# Patient Record
Sex: Male | Born: 1980 | Race: Black or African American | Hispanic: No | Marital: Single | Smoking: Current some day smoker
Health system: Southern US, Community
[De-identification: ages and names within clinical notes are randomized; demographics above are authoritative.]

## PROBLEM LIST (undated history)

## (undated) DIAGNOSIS — B20 Human immunodeficiency virus [HIV] disease: Secondary | ICD-10-CM

---

## 2005-01-31 ENCOUNTER — Ambulatory Visit: Payer: Self-pay | Admitting: Family Medicine

## 2005-07-01 ENCOUNTER — Emergency Department (HOSPITAL_COMMUNITY): Admission: EM | Admit: 2005-07-01 | Discharge: 2005-07-01 | Payer: Self-pay | Admitting: Emergency Medicine

## 2007-05-28 IMAGING — CT CT HEAD W/O CM
1 series · 16 of 30 positions shown, 20 images · IV contrast (agent unspecified)
Comparison: None.

CLINICAL DATA: Possible seizure. 
 HEAD CT WITHOUT CONTRAST:
TECHNIQUE: Contiguous axial images were obtained from the base of the skull through the vertex according to standard protocol without contrast.
 There is no evidence of intracranial hemorrhage, brain edema, or mass effect.  No other intra-axial abnormalities are seen, and the ventricles are within normal limits.  No abnormal extra-axial fluid collections or masses are identified.  No skull abnormalities are noted.

[Series 2578: — · axial · 0.46mm/px · z∈[-654,-519]mm · 16 of 30 slices shown, 20 images]
[im 2/30  brain]
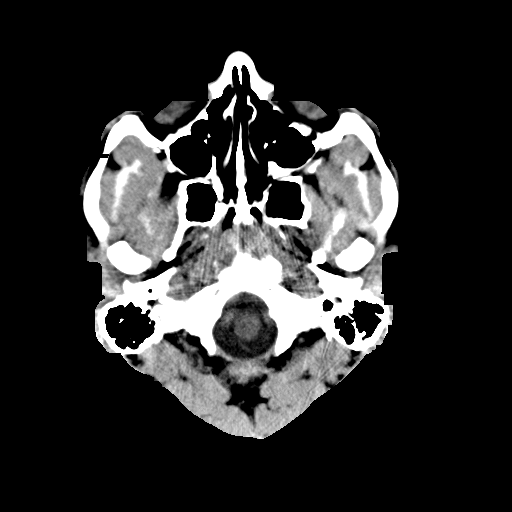
[im 2/30  bone]
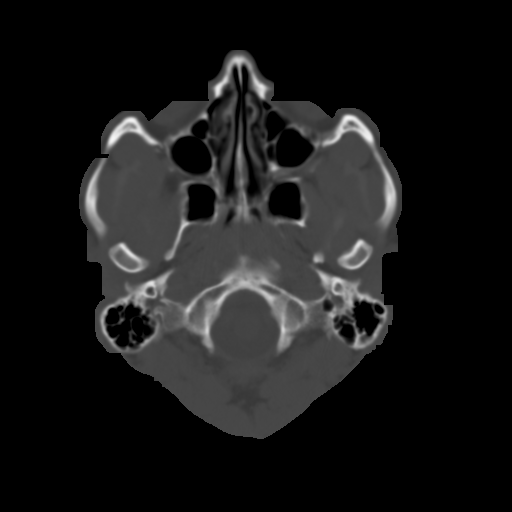
[im 4/30  brain]
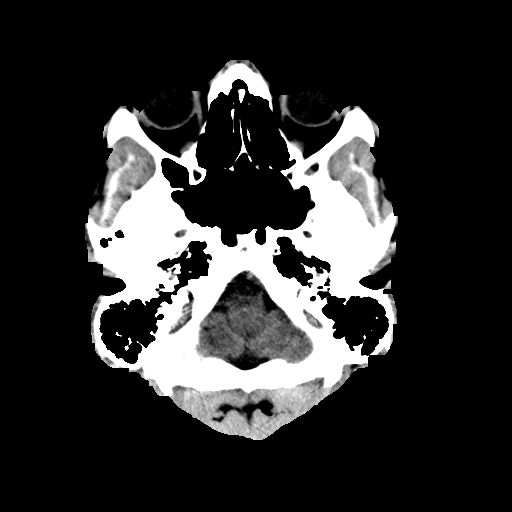
[im 6/30  brain]
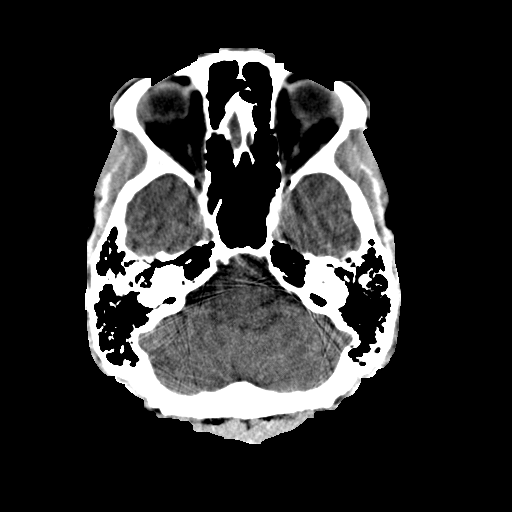
[im 8/30  brain]
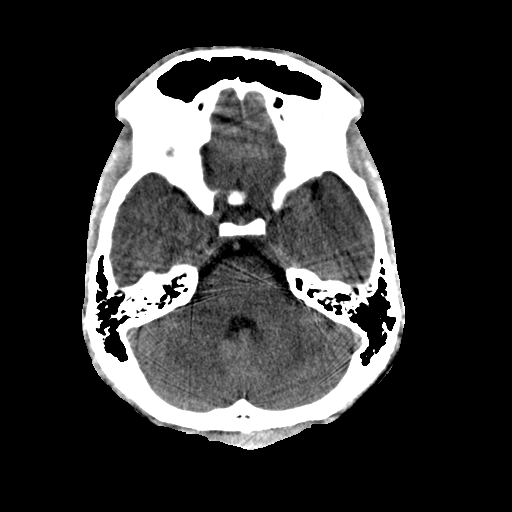
[im 9/30  brain]
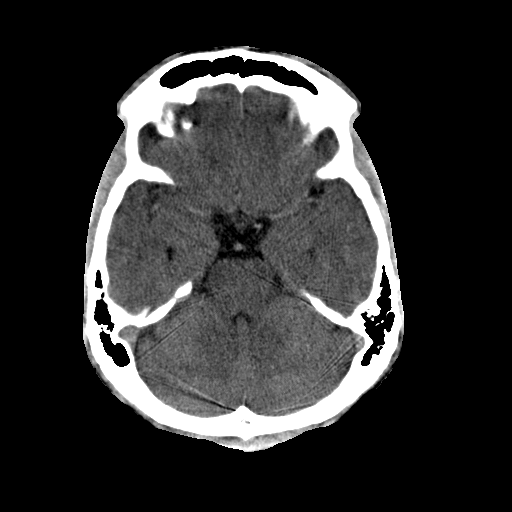
[im 9/30  bone]
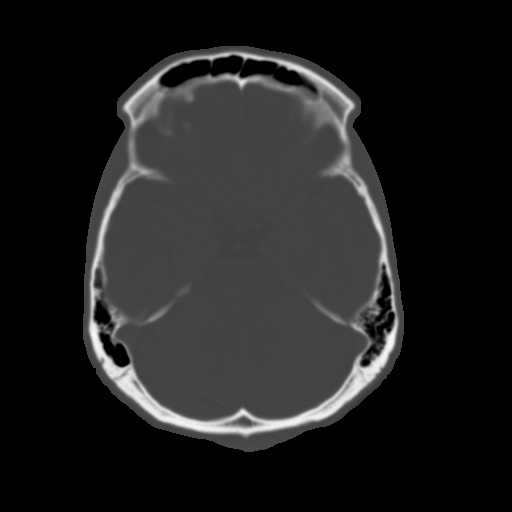
[im 11/30  brain]
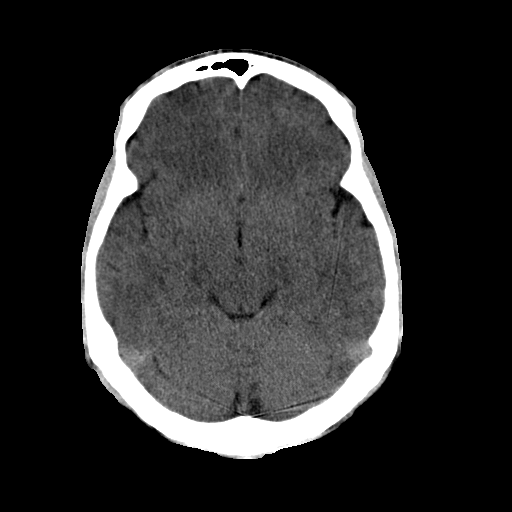
[im 13/30  brain]
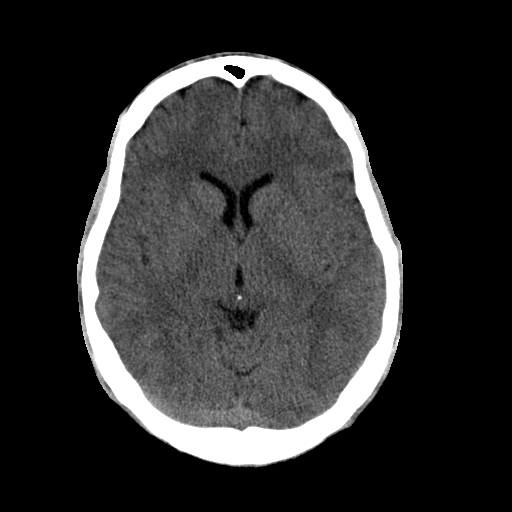
[im 15/30  brain]
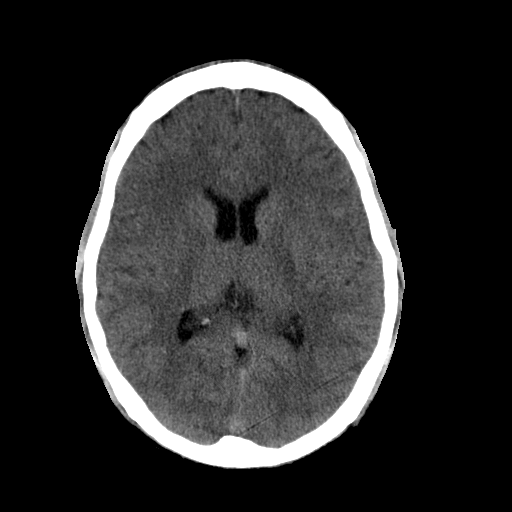
[im 16/30  brain]
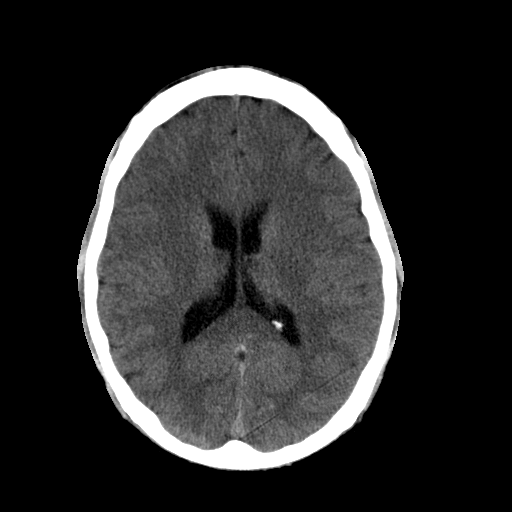
[im 16/30  bone]
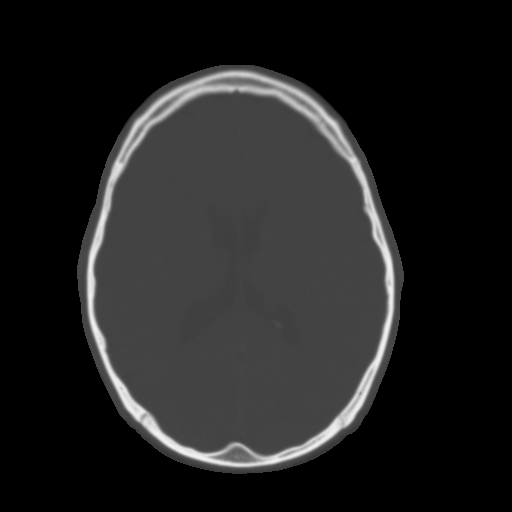
[im 18/30  brain]
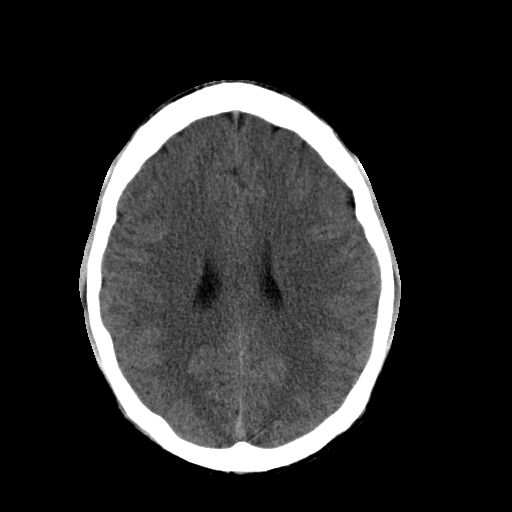
[im 20/30  brain]
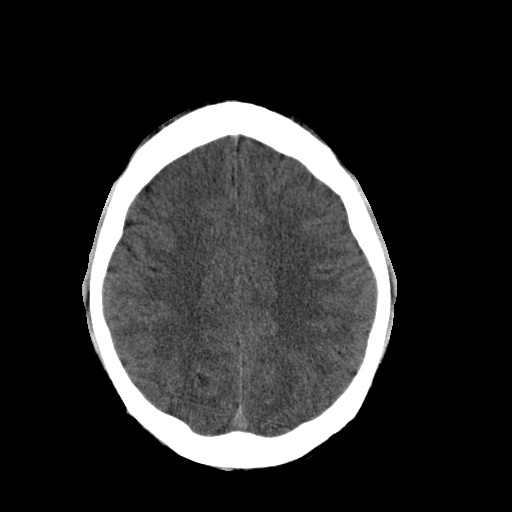
[im 22/30  brain]
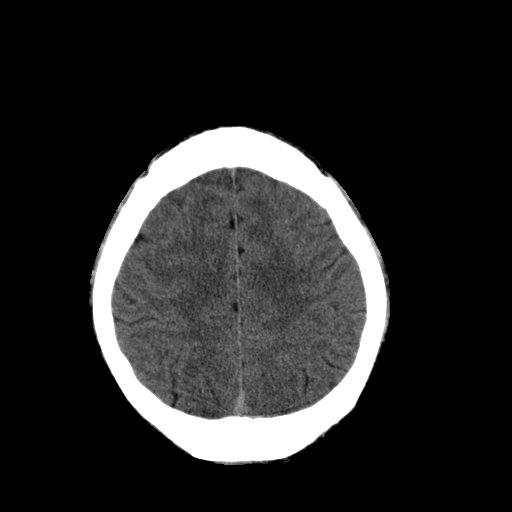
[im 23/30  brain]
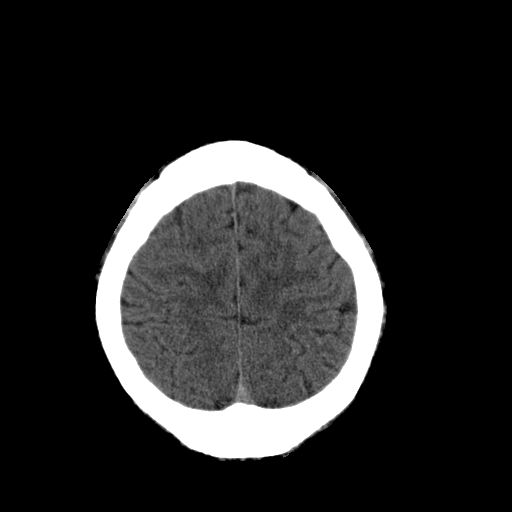
[im 23/30  bone]
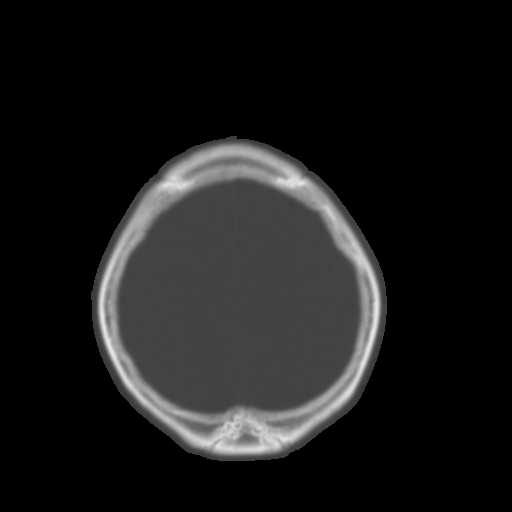
[im 25/30  brain]
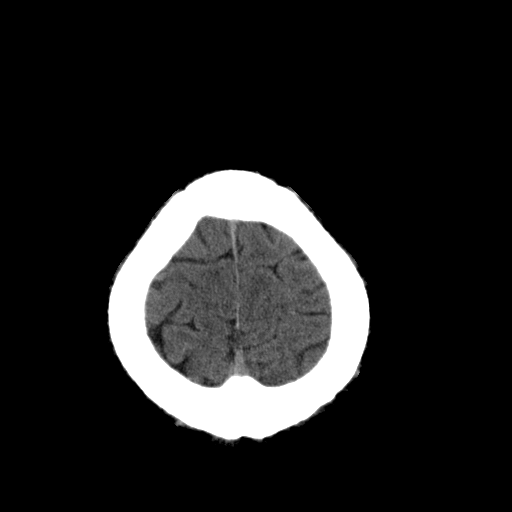
[im 27/30  brain]
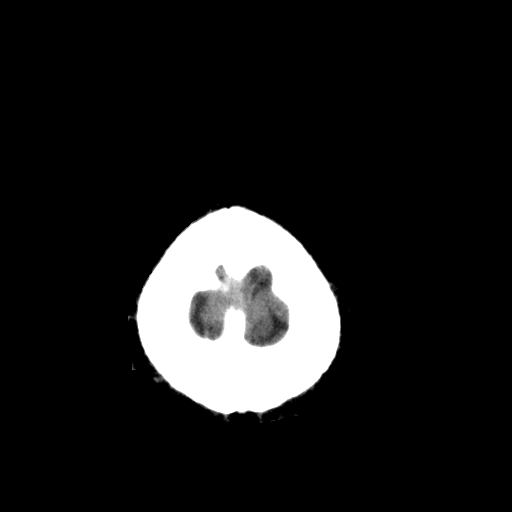
[im 29/30  brain]
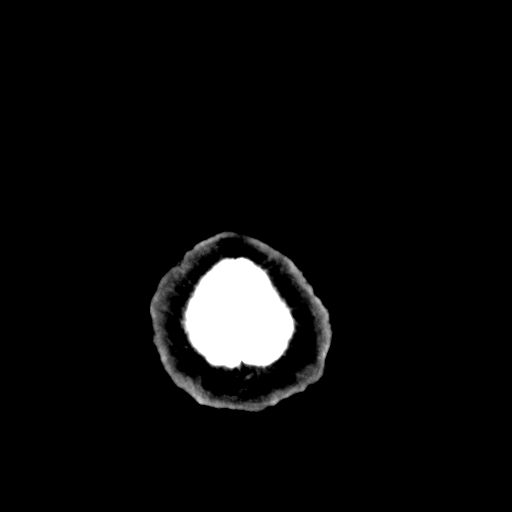

[16 of 30 positions shown; findings below may reference images not displayed]

IMPRESSION: Negative non-contrast head CT.

## 2009-11-26 ENCOUNTER — Emergency Department (HOSPITAL_COMMUNITY): Admission: EM | Admit: 2009-11-26 | Discharge: 2009-11-26 | Payer: Self-pay | Admitting: Emergency Medicine

## 2010-12-09 LAB — DIFFERENTIAL
Basophils Absolute: 0 10*3/uL (ref 0.0–0.1)
Basophils Relative: 0 % (ref 0–1)
Eosinophils Absolute: 0 10*3/uL (ref 0.0–0.7)
Eosinophils Relative: 0 % (ref 0–5)
Lymphocytes Relative: 62 % — ABNORMAL HIGH (ref 12–46)
Lymphs Abs: 6.9 10*3/uL — ABNORMAL HIGH (ref 0.7–4.0)
Monocytes Absolute: 1.8 10*3/uL — ABNORMAL HIGH (ref 0.1–1.0)
Monocytes Relative: 16 % — ABNORMAL HIGH (ref 3–12)
Neutro Abs: 2.5 10*3/uL (ref 1.7–7.7)
Neutrophils Relative %: 22 % — ABNORMAL LOW (ref 43–77)

## 2010-12-09 LAB — CBC
HCT: 41.5 % (ref 39.0–52.0)
Hemoglobin: 14.1 g/dL (ref 13.0–17.0)
MCHC: 34 g/dL (ref 30.0–36.0)
MCV: 82.6 fL (ref 78.0–100.0)
Platelets: 136 10*3/uL — ABNORMAL LOW (ref 150–400)
RBC: 5.03 MIL/uL (ref 4.22–5.81)
RDW: 13.3 % (ref 11.5–15.5)
WBC: 11.2 10*3/uL — ABNORMAL HIGH (ref 4.0–10.5)

## 2010-12-09 LAB — URINALYSIS, ROUTINE W REFLEX MICROSCOPIC
Glucose, UA: NEGATIVE mg/dL
Hgb urine dipstick: NEGATIVE
Ketones, ur: NEGATIVE mg/dL
Nitrite: NEGATIVE
Protein, ur: NEGATIVE mg/dL
Specific Gravity, Urine: 1.02 (ref 1.005–1.030)
Urobilinogen, UA: >8 mg/dL — ABNORMAL HIGH (ref 0.0–1.0)
pH: 7.5 (ref 5.0–8.0)

## 2010-12-09 LAB — BASIC METABOLIC PANEL
BUN: 8 mg/dL (ref 6–23)
CO2: 25 mEq/L (ref 19–32)
Calcium: 8.2 mg/dL — ABNORMAL LOW (ref 8.4–10.5)
Chloride: 99 mEq/L (ref 96–112)
Creatinine, Ser: 1.07 mg/dL (ref 0.4–1.5)
GFR calc Af Amer: >60 mL/min (ref >60)
GFR calc non Af Amer: >60 mL/min (ref >60)
Glucose, Bld: 104 mg/dL — ABNORMAL HIGH (ref 70–99)
Potassium: 3.4 mEq/L — ABNORMAL LOW (ref 3.5–5.1)
Sodium: 132 mEq/L — ABNORMAL LOW (ref 135–145)

## 2010-12-09 LAB — CK: Total CK: 41 U/L (ref 7–232)

## 2010-12-09 LAB — HEPATIC FUNCTION PANEL
ALT: 150 U/L — ABNORMAL HIGH (ref 0–53)
AST: 127 U/L — ABNORMAL HIGH (ref 0–37)
Albumin: 3 g/dL — ABNORMAL LOW (ref 3.5–5.2)
Alkaline Phosphatase: 83 U/L (ref 39–117)
Bilirubin, Direct: 0.4 mg/dL — ABNORMAL HIGH (ref 0.0–0.3)
Indirect Bilirubin: 0.6 mg/dL (ref 0.3–0.9)
Total Bilirubin: 1 mg/dL (ref 0.3–1.2)
Total Protein: 6.4 g/dL (ref 6.0–8.3)

## 2014-07-26 ENCOUNTER — Emergency Department (HOSPITAL_COMMUNITY): Payer: Self-pay

## 2014-07-26 ENCOUNTER — Encounter (HOSPITAL_COMMUNITY): Payer: Self-pay | Admitting: Emergency Medicine

## 2014-07-26 ENCOUNTER — Emergency Department (HOSPITAL_COMMUNITY)
Admission: EM | Admit: 2014-07-26 | Discharge: 2014-07-26 | Disposition: A | Discharge status: Home / Self Care | Payer: Self-pay | Attending: Emergency Medicine | Admitting: Emergency Medicine

## 2014-07-26 DIAGNOSIS — Z720 Tobacco use: Secondary | ICD-10-CM | POA: Insufficient documentation

## 2014-07-26 DIAGNOSIS — T1490XA Injury, unspecified, initial encounter: Secondary | ICD-10-CM

## 2014-07-26 DIAGNOSIS — Z21 Asymptomatic human immunodeficiency virus [HIV] infection status: Secondary | ICD-10-CM | POA: Insufficient documentation

## 2014-07-26 DIAGNOSIS — S93402A Sprain of unspecified ligament of left ankle, initial encounter: Secondary | ICD-10-CM | POA: Insufficient documentation

## 2014-07-26 DIAGNOSIS — Y92320 Baseball field as the place of occurrence of the external cause: Secondary | ICD-10-CM | POA: Insufficient documentation

## 2014-07-26 DIAGNOSIS — Y9364 Activity, baseball: Secondary | ICD-10-CM | POA: Insufficient documentation

## 2014-07-26 DIAGNOSIS — Y998 Other external cause status: Secondary | ICD-10-CM | POA: Insufficient documentation

## 2014-07-26 DIAGNOSIS — X58XXXA Exposure to other specified factors, initial encounter: Secondary | ICD-10-CM | POA: Insufficient documentation

## 2014-07-26 DIAGNOSIS — Z791 Long term (current) use of non-steroidal anti-inflammatories (NSAID): Secondary | ICD-10-CM | POA: Insufficient documentation

## 2014-07-26 HISTORY — DX: Human immunodeficiency virus (HIV) disease: B20

## 2014-07-26 MED ORDER — HYDROCODONE-ACETAMINOPHEN 5-325 MG PO TABS: 1.0000 | ORAL_TABLET | ORAL | Status: AC | PRN

## 2014-07-26 MED ORDER — KETOROLAC TROMETHAMINE 10 MG PO TABS
10.0000 mg | ORAL_TABLET | Freq: Once | ORAL | Status: AC
  Administered 2014-07-26: 10 mg via ORAL
  Filled 2014-07-26: qty 1

## 2014-07-26 MED ORDER — DICLOFENAC SODIUM 75 MG PO TBEC: 75.0000 mg | DELAYED_RELEASE_TABLET | Freq: Two times a day (BID) | ORAL | Status: AC

## 2014-07-26 MED ORDER — HYDROCODONE-ACETAMINOPHEN 5-325 MG PO TABS
1.0000 | ORAL_TABLET | Freq: Once | ORAL | Status: AC
  Administered 2014-07-26: 1 via ORAL
  Filled 2014-07-26: qty 1

## 2014-07-26 NOTE — ED Notes (Signed)
Pt was playing softball last night, injured his L ankle when he slid into a base.

## 2014-07-26 NOTE — ED Provider Notes (Signed)
CSN: [id number]     Arrival date & time 07/26/14  1420 History   First MD Initiated Contact with Patient 07/26/14 1629     Chief Complaint  Patient presents with  . Ankle Pain     (Consider location/radiation/quality/duration/timing/severity/associated sxs/prior Treatment) Patient is a 33 y.o. male presenting with ankle pain. The history is provided by the patient.  Ankle Pain Location:  Ankle Injury: yes   Mechanism of injury comment:  Slid into base playing baseball. Ankle location:  L ankle Pain details:    Quality:  Aching and throbbing   Radiates to:  Does not radiate   Severity:  Moderate   Onset quality:  Sudden   Duration:  1 day   Timing:  Constant   Progression:  Unchanged Chronicity:  New Dislocation: no   Foreign body present:  No foreign bodies Relieved by:  Nothing Worsened by:  Bearing weight Ineffective treatments:  Compression Associated symptoms: decreased ROM and swelling   Associated symptoms: no back pain, no neck pain, no numbness and no tingling   Risk factors: no frequent fractures     Past Medical History  Diagnosis Date  . HIV (human immunodeficiency virus infection)    History reviewed. No pertinent past surgical history. Family History  Problem Relation Age of Onset  . Diabetes Other   . Heart failure Other    History  Substance Use Topics  . Smoking status: Current Some Day Smoker  . Smokeless tobacco: Never Used  . Alcohol Use: No    Review of Systems  Constitutional: Negative for activity change.       All ROS Neg except as noted in HPI  Eyes: Negative for photophobia and discharge.  Respiratory: Negative for cough, shortness of breath and wheezing.   Cardiovascular: Negative for chest pain and palpitations.  Gastrointestinal: Negative for abdominal pain and blood in stool.  Genitourinary: Negative for dysuria, frequency and hematuria.  Musculoskeletal: Negative for back pain, arthralgias and neck pain.  Skin: Negative.    Neurological: Negative for dizziness, seizures and speech difficulty.  Psychiatric/Behavioral: Negative for hallucinations and confusion.      Allergies  Review of patient's allergies indicates no known allergies.  Home Medications   Prior to Admission medications   Medication Sig Start Date End Date Taking? Authorizing Provider  ATRIPLA 600-200-300 MG per tablet Take 1 tablet by mouth every evening.  07/20/14  Yes Historical Provider, MD  diclofenac (VOLTAREN) 75 MG EC tablet Take 1 tablet (75 mg total) by mouth 2 (two) times daily. 07/26/14   Kathie DikeHobson M Deklan Minar, PA-C  HYDROcodone-acetaminophen (NORCO/VICODIN) 5-325 MG per tablet Take 1 tablet by mouth every 4 (four) hours as needed. 07/26/14   Kathie DikeHobson M Raheim Beutler, PA-C   BP 146/80 mmHg  Pulse 90  Temp(Src) 98.9 F (37.2 C) (Oral)  Resp 16  Ht 6\' 1"  (1.854 m)  Wt 175 lb (79.379 kg)  BMI 23.09 kg/m2  SpO2 100% Physical Exam  Constitutional: He is oriented to person, place, and time. He appears well-developed and well-nourished.  Non-toxic appearance.  HENT:  Head: Normocephalic.  Right Ear: Tympanic membrane and external ear normal.  Left Ear: Tympanic membrane and external ear normal.  Eyes: EOM and lids are normal. Pupils are equal, round, and reactive to light.  Neck: Normal range of motion. Neck supple. Carotid bruit is not present.  Cardiovascular: Normal rate, regular rhythm, normal heart sounds, intact distal pulses and normal pulses.   Pulmonary/Chest: Breath sounds normal. No respiratory distress.  Abdominal: Soft. Bowel sounds are normal. There is no tenderness. There is no guarding.  Musculoskeletal:       Left ankle: He exhibits decreased range of motion and swelling. He exhibits no deformity. Tenderness. Lateral malleolus tenderness found. Achilles tendon normal.  Lymphadenopathy:       Head (right side): No submandibular adenopathy present.       Head (left side): No submandibular adenopathy present.    He has no  cervical adenopathy.  Neurological: He is alert and oriented to person, place, and time. He has normal strength. No cranial nerve deficit or sensory deficit.  Skin: Skin is warm and dry.  Psychiatric: He has a normal mood and affect. His speech is normal.  Nursing note and vitals reviewed.   ED Course  Procedures (including critical care time) Labs Review Labs Reviewed - No data to display  Imaging Review Dg Ankle Complete Left  07/26/2014   CLINICAL DATA:  Lateral left ankle pain. Injury while sliding into a based plane softball yesterday. Initial encounter.  EXAM: LEFT ANKLE COMPLETE - 3+ VIEW  COMPARISON:  None.  FINDINGS: Lateral soft tissue swelling. No acute bony abnormality. Specifically, no fracture, subluxation, or dislocation. Soft tissues are intact.  IMPRESSION: No acute bony abnormality.   Electronically Signed   By: Charlett NoseKevin  Dover M.D.   On: 07/26/2014 15:22     EKG Interpretation None      MDM  Vital signs are within normal limits. Xray of the left ankle is neg for fx or dislocation. No neurovascular deficit. Plan - ASO splint for the next 10 days. Crutches. Rx for diclofenac and norco. Pt to see orthopedics if not improving.   Final diagnoses:  Ankle sprain, left, initial encounter    **I have reviewed nursing notes, vital signs, and all appropriate lab and imaging results for this patient.Kathie Dike*    Tyiana Hill M Levar Fayson, PA-C 07/26/14 1723  Benny LennertJoseph L Zammit, MD 07/26/14 [phone]

## 2014-07-26 NOTE — Discharge Instructions (Signed)
Apply ice. Elevate your ankle as much as possible. Use the ankle splint for the next 10 days. Use diclofenac daily with food. Use norco for pain if needed.This medication may cause drowsiness. Please do not drink, drive, or participate in activity that requires concentration while taking this medication. Ankle Sprain An ankle sprain is an injury to the strong, fibrous tissues (ligaments) that hold your ankle bones together.  HOME CARE   Put ice on your ankle for 1-2 days or as told by your doctor.  Put ice in a plastic bag.  Place a towel between your skin and the bag.  Leave the ice on for 15-20 minutes at a time, every 2 hours while you are awake.  Only take medicine as told by your doctor.  Raise (elevate) your injured ankle above the level of your heart as much as possible for 2-3 days.  Use crutches if your doctor tells you to. Slowly put your own weight on the affected ankle. Use the crutches until you can walk without pain.  If you have a plaster splint:  Do not rest it on anything harder than a pillow for 24 hours.  Do not put weight on it.  Do not get it wet.  Take it off to shower or bathe.  If given, use an elastic wrap or support stocking for support. Take the wrap off if your toes lose feeling (numb), tingle, or turn cold or blue.  If you have an air splint:  Add or let out air to make it comfortable.  Take it off at night and to shower and bathe.  Wiggle your toes and move your ankle up and down often while you are wearing it. GET HELP IF:  You have rapidly increasing bruising or puffiness (swelling).  Your toes feel very cold.  You lose feeling in your foot.  Your medicine does not help your pain. GET HELP RIGHT AWAY IF:   Your toes lose feeling (numb) or turn blue.  You have severe pain that is increasing. MAKE SURE YOU:   Understand these instructions.  Will watch your condition.  Will get help right away if you are not doing well or get  worse. Document Released: 02/18/2008 Document Revised: 01/16/2014 Document Reviewed: 03/15/2012 [facility] Patient Information 2015 [place], [place]. This information is not intended to replace advice given to you by your health care provider. Make sure you discuss any questions you have with your health care provider.

## 2016-06-21 IMAGING — CR DG ANKLE COMPLETE 3+V*L*
3 series · 3 of 3 positions shown · non-contrast
Comparison: None.

CLINICAL DATA: Lateral left ankle pain. Injury while sliding into a
based plane softball yesterday. Initial encounter.

EXAM:
LEFT ANKLE COMPLETE - 3+ VIEW

[view not recorded (1 of 3)]
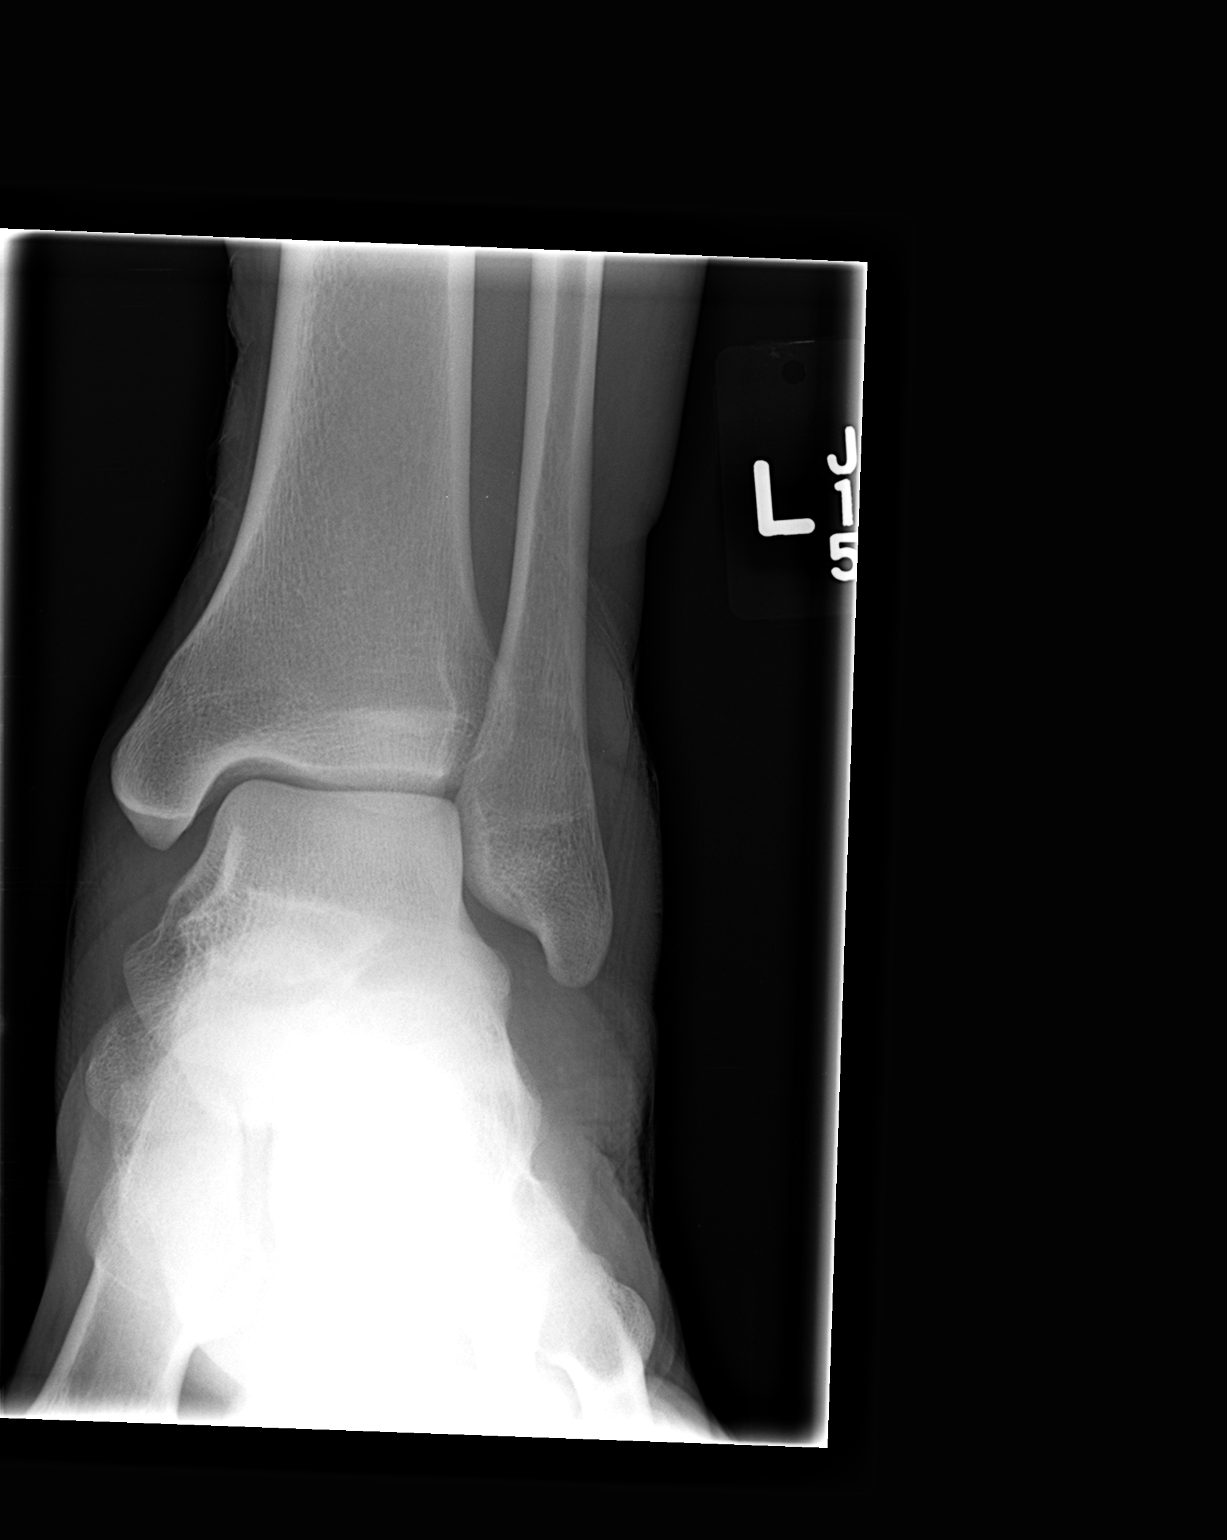

[view not recorded (2 of 3)]
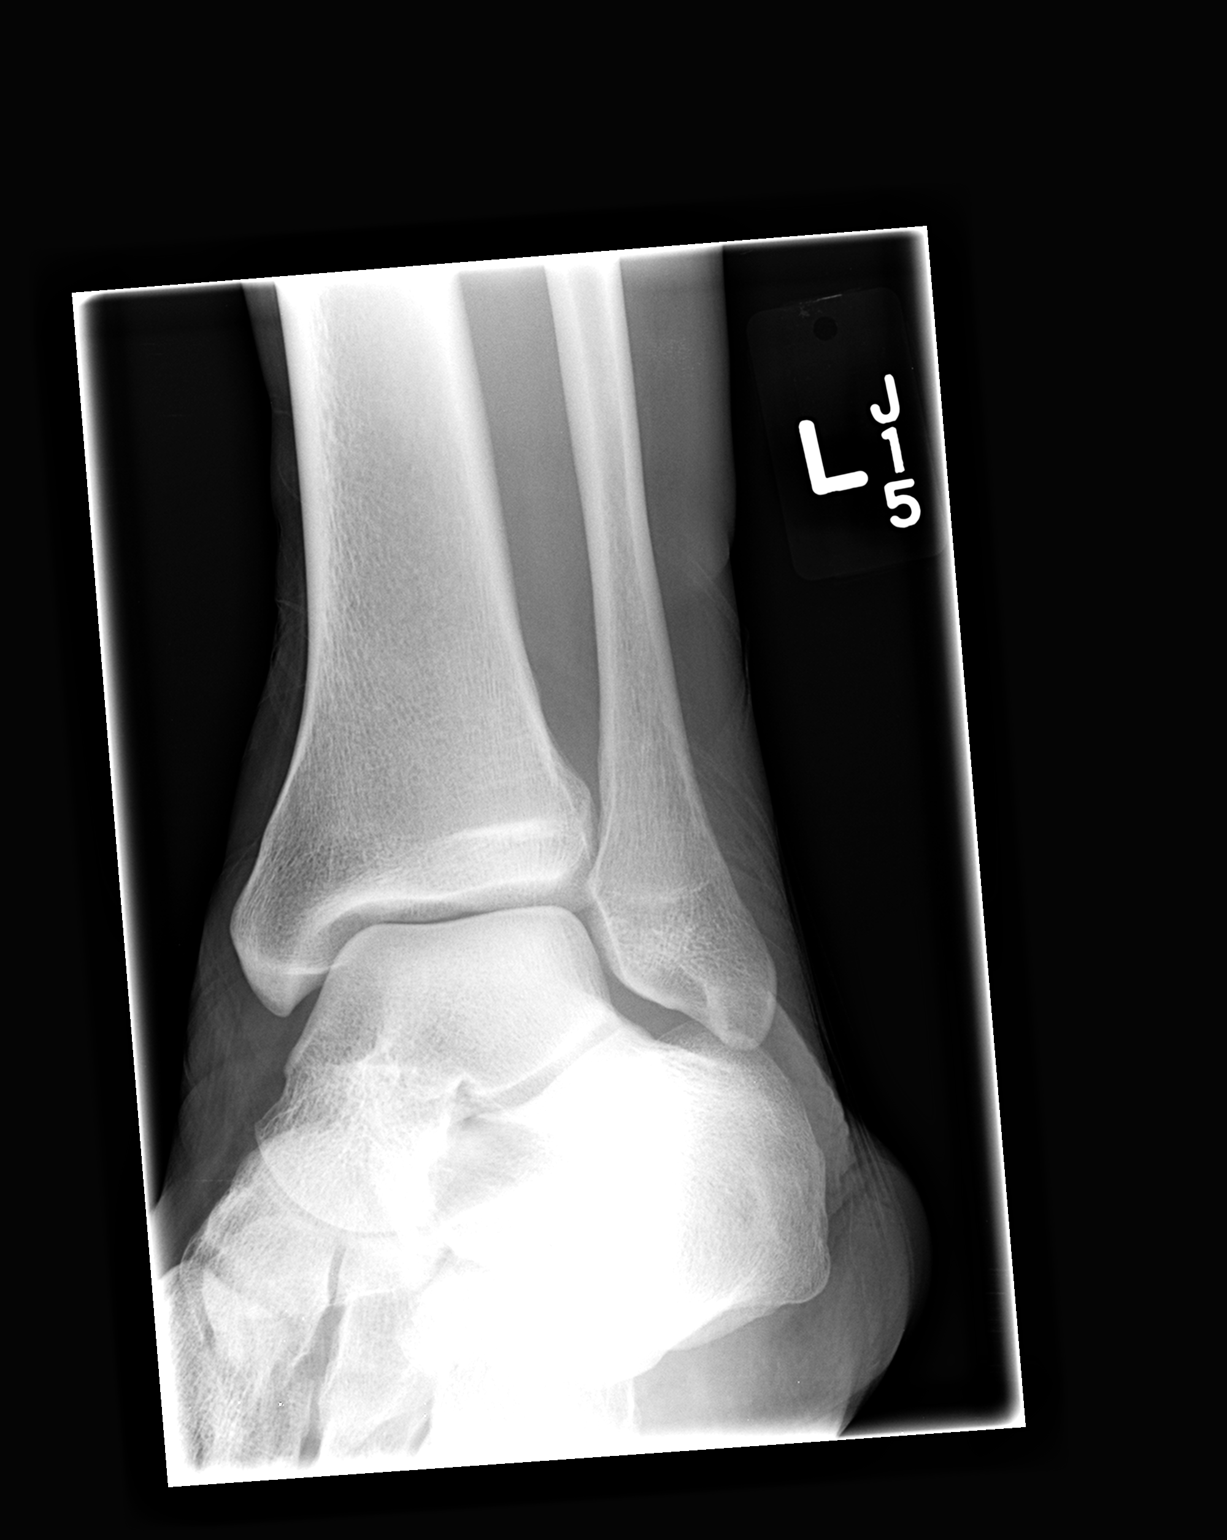

[view not recorded (3 of 3)]
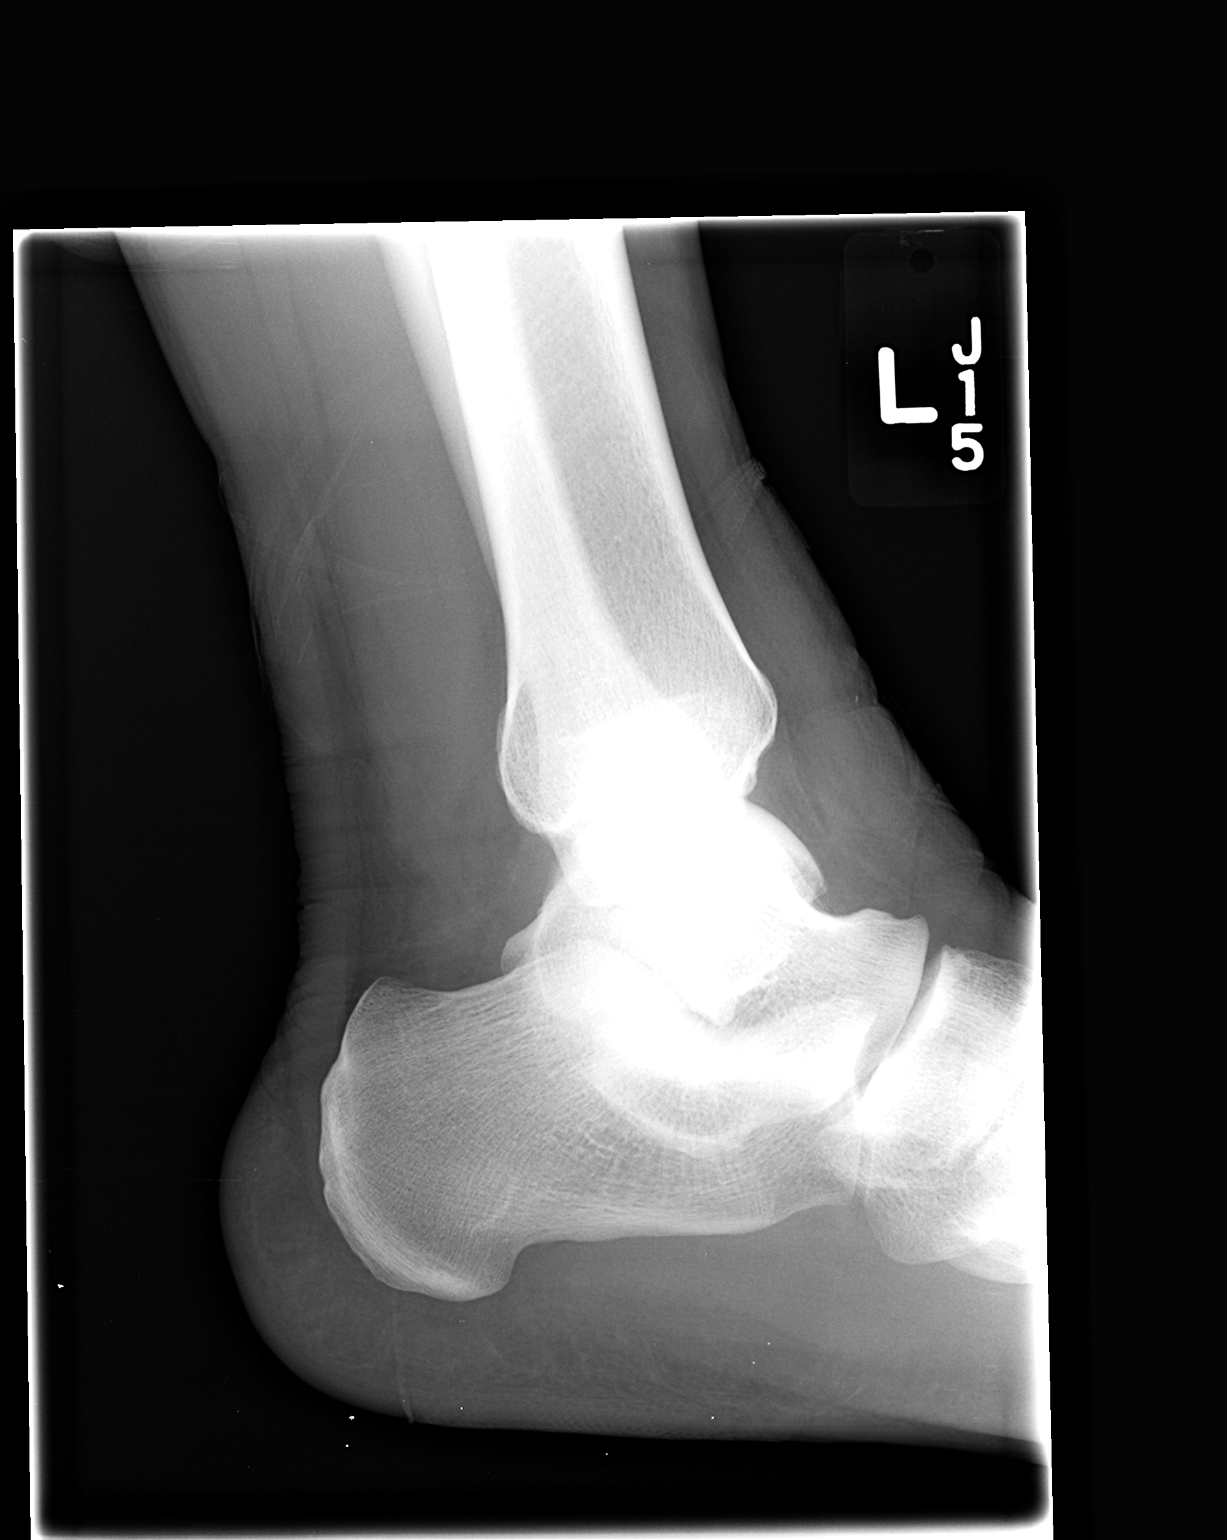

[3 of 3 positions shown; findings below may reference images not displayed]

FINDINGS: Lateral soft tissue swelling. No acute bony abnormality.
Specifically, no fracture, subluxation, or dislocation. Soft tissues
are intact.
IMPRESSION: No acute bony abnormality.

## 2019-10-04 ENCOUNTER — Other Ambulatory Visit: Payer: Self-pay

## 2019-10-04 ENCOUNTER — Ambulatory Visit: Payer: Self-pay | Attending: Internal Medicine

## 2019-10-04 DIAGNOSIS — Z20822 Contact with and (suspected) exposure to covid-19: Secondary | ICD-10-CM | POA: Insufficient documentation

## 2019-10-05 ENCOUNTER — Telehealth: Payer: Self-pay

## 2019-10-05 LAB — NOVEL CORONAVIRUS, NAA: SARS-CoV-2, NAA: NOT DETECTED

## 2019-10-05 NOTE — Telephone Encounter (Signed)
Pt notified of negative COVID-19 results. Understanding verbalized.  Chasta M Hopkins   

## 2022-10-07 DIAGNOSIS — Z2989 Encounter for other specified prophylactic measures: Secondary | ICD-10-CM | POA: Diagnosis not present

## 2022-10-07 DIAGNOSIS — B20 Human immunodeficiency virus [HIV] disease: Secondary | ICD-10-CM | POA: Diagnosis not present
